# Patient Record
Sex: Male | Born: 1955 | Race: Asian | Hispanic: No | Marital: Married | Smoking: Never smoker
Health system: Southern US, Community
[De-identification: ages and names within clinical notes are randomized; demographics above are authoritative.]

## PROBLEM LIST (undated history)

## (undated) DIAGNOSIS — E785 Hyperlipidemia, unspecified: Secondary | ICD-10-CM

---

## 2015-03-28 ENCOUNTER — Emergency Department (HOSPITAL_COMMUNITY)
Admission: EM | Admit: 2015-03-28 | Discharge: 2015-03-28 | Disposition: A | Discharge status: Home / Self Care | Payer: BC Managed Care – PPO | Attending: Emergency Medicine | Admitting: Emergency Medicine

## 2015-03-28 ENCOUNTER — Emergency Department (HOSPITAL_COMMUNITY): Payer: BC Managed Care – PPO

## 2015-03-28 ENCOUNTER — Encounter (HOSPITAL_COMMUNITY): Payer: Self-pay | Admitting: *Deleted

## 2015-03-28 DIAGNOSIS — W28XXXA Contact with powered lawn mower, initial encounter: Secondary | ICD-10-CM | POA: Insufficient documentation

## 2015-03-28 DIAGNOSIS — S61309A Unspecified open wound of unspecified finger with damage to nail, initial encounter: Secondary | ICD-10-CM

## 2015-03-28 DIAGNOSIS — S61313A Laceration without foreign body of left middle finger with damage to nail, initial encounter: Secondary | ICD-10-CM | POA: Diagnosis present

## 2015-03-28 DIAGNOSIS — Y9289 Other specified places as the place of occurrence of the external cause: Secondary | ICD-10-CM | POA: Insufficient documentation

## 2015-03-28 DIAGNOSIS — Y999 Unspecified external cause status: Secondary | ICD-10-CM | POA: Insufficient documentation

## 2015-03-28 DIAGNOSIS — Y9389 Activity, other specified: Secondary | ICD-10-CM | POA: Insufficient documentation

## 2015-03-28 DIAGNOSIS — Z8349 Family history of other endocrine, nutritional and metabolic diseases: Secondary | ICD-10-CM | POA: Diagnosis not present

## 2015-03-28 DIAGNOSIS — Z23 Encounter for immunization: Secondary | ICD-10-CM | POA: Diagnosis not present

## 2015-03-28 HISTORY — DX: Hyperlipidemia, unspecified: E78.5

## 2015-03-28 MED ORDER — BUPIVACAINE HCL (PF) 0.5 % IJ SOLN
10.0000 mL | Freq: Once | INTRAMUSCULAR | Status: AC
Start: 2015-03-28 — End: 2015-03-28
  Administered 2015-03-28: 10 mL
  Filled 2015-03-28: qty 30

## 2015-03-28 MED ORDER — CEPHALEXIN 500 MG PO CAPS: 500.0000 mg | ORAL_CAPSULE | Freq: Four times a day (QID) | ORAL | Status: AC

## 2015-03-28 MED ORDER — OXYCODONE-ACETAMINOPHEN 5-325 MG PO TABS
2.0000 | ORAL_TABLET | Freq: Once | ORAL | Status: AC
Start: 2015-03-28 — End: 2015-03-28
  Administered 2015-03-28: 2 via ORAL
  Filled 2015-03-28: qty 2

## 2015-03-28 MED ORDER — HYDROGEN PEROXIDE 3 % EX SOLN
CUTANEOUS | Status: AC
  Administered 2015-03-28: 15:00:00
  Filled 2015-03-28: qty 473

## 2015-03-28 MED ORDER — TETANUS-DIPHTH-ACELL PERTUSSIS 5-2.5-18.5 LF-MCG/0.5 IM SUSP
0.5000 mL | Freq: Once | INTRAMUSCULAR | Status: AC
  Administered 2015-03-28: 0.5 mL via INTRAMUSCULAR
  Filled 2015-03-28: qty 0.5

## 2015-03-28 MED ORDER — OXYCODONE-ACETAMINOPHEN 5-325 MG PO TABS: 2.0000 | ORAL_TABLET | ORAL | Status: AC | PRN

## 2015-03-28 MED ORDER — ONDANSETRON 4 MG PO TBDP
4.0000 mg | ORAL_TABLET | Freq: Once | ORAL | Status: AC
  Administered 2015-03-28: 4 mg via ORAL
  Filled 2015-03-28: qty 1

## 2015-03-28 NOTE — ED Notes (Signed)
Lawn mower wheel was stuck, attempted to adjust wheel and accidentally came in contact with lawn mower blade, while on. Distal palmar surface of left middle finger - degloved. Bleeding controlled

## 2015-03-28 NOTE — ED Provider Notes (Signed)
CSN: [id number]     Arrival date & time 03/28/15  1257 History  This chart was scribed for non-physician provider Hebert Soho, PA-C, working with Layla Maw Ward, DO by Phillis Haggis, ED Scribe. This patient was seen in room WTR9/WTR9 and patient care was started at 1:35 PM.   Chief Complaint  Patient presents with  . Laceration    The history is provided by the patient. No language interpreter was used.   HPI Comments: Brian Gardner is a 59 y.o. male who presents to the Emergency Department complaining of a left middle finger laceration onset 2 hours ago. He states that he was trying to adjust a blade in his lawn mower when he cut the distal palmar surface of the left middle finger; back of tip of finger is missing and the pt does not know where the rest of the finger is. He states that his last tdap was in 2006. He denies taking anti-coagulants today. He denies history of DM or any other medical problems. He denies numbness, weakness, or rash.   Past Medical History  Diagnosis Date  . Dyslipidemia    History reviewed. No pertinent past surgical history. History reviewed. No pertinent family history. History  Substance Use Topics  . Smoking status: Never Smoker   . Smokeless tobacco: Not on file  . Alcohol Use: No    Review of Systems  Skin: Positive for wound. Negative for rash.  Neurological: Negative for weakness and numbness.   Allergies  Review of patient's allergies indicates no known allergies.  Home Medications   Prior to Admission medications   Medication Sig Start Date End Date Taking? Authorizing Provider  cephALEXin (KEFLEX) 500 MG capsule Take 1 capsule (500 mg total) by mouth 4 (four) times daily. 03/28/15   Saphyre Cillo Patel-Mills, PA-C  oxyCODONE-acetaminophen (PERCOCET/ROXICET) 5-325 MG per tablet Take 2 tablets by mouth every 4 (four) hours as needed for severe pain. 03/28/15   Zaivion Kundrat Patel-Mills, PA-C   Blood pressure 134/85, pulse 73, temperature 98 F  (36.7 C), temperature source Oral, resp. rate 17, SpO2 100 %.  Physical Exam  Constitutional: He is oriented to person, place, and time. He appears well-developed and well-nourished.  HENT:  Head: Normocephalic and atraumatic.  Eyes: EOM are normal.  Neck: Normal range of motion. Neck supple.  Cardiovascular: Normal rate.   Good radial pulses.   Pulmonary/Chest: Effort normal.  Musculoskeletal: Normal range of motion.  Full flexion and extension of left middle finger; nail intact; finger avulsion of the volar surface of the distal middle left finger. No bone exposed. Fat can be visualized but no tendon.  Neurological: He is alert and oriented to person, place, and time.  Skin: Skin is warm and dry.  Psychiatric: He has a normal mood and affect. His behavior is normal.  Nursing note and vitals reviewed.   ED Course  NERVE BLOCK Date/Time: 03/28/2015 2:19 PM Performed by: Catha Gosselin Authorized by: Catha Gosselin Consent: Verbal consent obtained. Written consent obtained. Risks and benefits: risks, benefits and alternatives were discussed Consent given by: patient Patient understanding: patient states understanding of the procedure being performed Patient consent: the patient's understanding of the procedure matches consent given Imaging studies: imaging studies available Patient identity confirmed: verbally with patient Indications: pain relief and debridement Body area: upper extremity Nerve: digital Laterality: left Patient sedated: no Preparation: Patient was prepped and draped in the usual sterile fashion. Patient position: sitting Needle gauge: 25 G Local anesthetic: bupivacaine 0.5% without epinephrine Anesthetic total:  5 ml Outcome: pain improved Patient tolerance: Patient tolerated the procedure well with no immediate complications   (including critical care time) DIAGNOSTIC STUDIES: Oxygen Saturation is 100% on RA, normal by my interpretation.     COORDINATION OF CARE: 1:37 PM-Discussed treatment plan which includes x-ray of finger, updating tdap, call to hand surgeon, cleaning of wound with pt at bedside and pt agreed to plan.   2:00 PM-Digital nerve block given to pt for pain relief  Labs Reviewed - No data to display  Imaging Review Dg Finger Middle Left  03/28/2015   CLINICAL DATA:  Post lawnmower injury, now with degloving.  EXAM: LEFT MIDDLE FINGER 2+V  COMPARISON:  None.  FINDINGS: There is an obliquely orientated amputation involving the soft tissues about the distal tip of the middle digit. This finding is without associated fracture or radiopaque foreign body.  IMPRESSION: Soft tissue amputation involving the distal tip of the middle phalanx without associated fracture or radiopaque foreign body.   Electronically Signed   By: Simonne Come M.D.   On: 03/28/2015 13:58     EKG Interpretation None      MDM   Final diagnoses:  Fingernail avulsion, partial, initial encounter   Patient's vitals are stable. X-ray of the middle left finger shows soft tissue amputation involving the distal tip of the middle phalanx without fracture or foreign body. I do not suspect a tendon rupture. He is able to flex and extend fingers at all joints. Tetanus was updated. Digital block was done in order to provide the patient relief for several hours and to be able to irrigate the wound. Patient's finger was thoroughly cleaned and soaked in saline with peroxide solution.  I consult to Dr. Karen Kays mouth, hand surgeon who suggested oral anabiotic's, applying Xeroform, wrapping the finger with gauze and Coban. I discussed keeping the wound clean and dry and not removing the dressing until he follows up with orthopedics. Patient can take Tylenol for pain and Percocet for breakthrough pain. Patient verbally agrees with the plan. Medications  hydrogen peroxide 3 % external solution (not administered)  bupivacaine (MARCAINE) 0.5 % injection 10 mL (10 mLs  Infiltration Given 03/28/15 1424)  Tdap (BOOSTRIX) injection 0.5 mL (0.5 mLs Intramuscular Given 03/28/15 1425)  oxyCODONE-acetaminophen (PERCOCET/ROXICET) 5-325 MG per tablet 2 tablet (2 tablets Oral Given 03/28/15 1424)   I personally performed the services described in this documentation, which was scribed in my presence. The recorded information has been reviewed and is accurate.    Catha Gosselin, PA-C 03/28/15 1500  Layla Maw Ward, DO 03/28/15 [phone]

## 2015-03-28 NOTE — Discharge Instructions (Signed)
Finger Avulsion Keep dressing clean and dry until you follow up with Dr. Merlyn Lot on Monday. Take anabiotic says prescribed. Do not overuse the left hand. Take Tylenol for pain and Percocet for breakthrough pain. Do not drive while taking pain medication. When the tip of the finger is lost, a new nail may grow back if part of the fingernail is left. The new nail may be deformed. If just the tip of the finger is lost, no repair may be needed unless there is bone showing. If bone is showing, your caregiver may need to remove the protruding bone and put on a bandage. Your caregiver will do what is best for you. Most of the time when a fingertip is lost, the end will gradually grow back on and look fairly normal, but it may remain sensitive to pressure and temperature extremes for a long time. HOME CARE INSTRUCTIONS   Keep your hand elevated above your heart to relieve pain and swelling.  Keep your dressing dry and clean.  Change your bandage in 24 hours or as directed.  Only take over-the-counter or prescription medicines for pain, discomfort, or fever as directed by your caregiver.  See your caregiver as needed for problems. SEEK MEDICAL CARE IF:   You have increased pain, swelling, drainage, or bleeding.  You have a fever.  You have swelling that spreads from your finger and into your hand. Make sure to check to see if you need a tetanus booster. Document Released: 11/28/2001 Document Revised: 03/20/2012 Document Reviewed: 10/23/2008 [facility] Patient Information 2015 [place], [place]. This information is not intended to replace advice given to you by your health care provider. Make sure you discuss any questions you have with your health care provider.

## 2016-09-01 IMAGING — CR DG FINGER MIDDLE 2+V*L*
3 series · 3 of 3 positions shown · non-contrast
Comparison: None.

CLINICAL DATA: Post lawnmower injury, now with degloving.

EXAM:
LEFT MIDDLE FINGER 2+V

[x finger pa left]
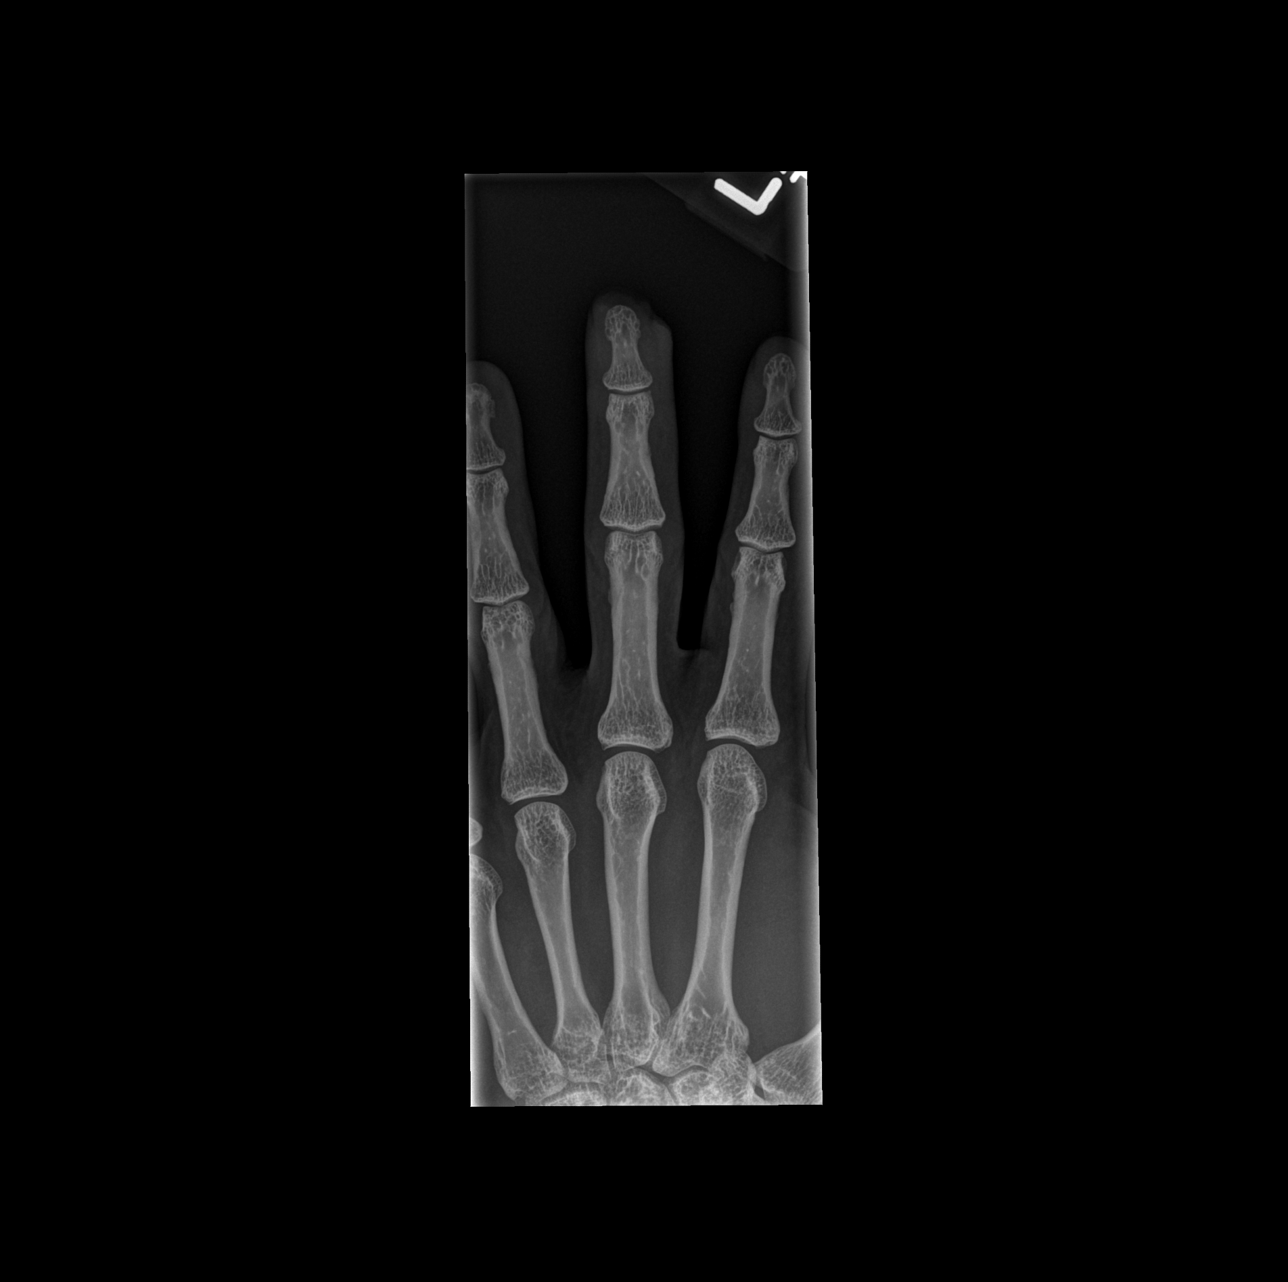

[x finger obl left]
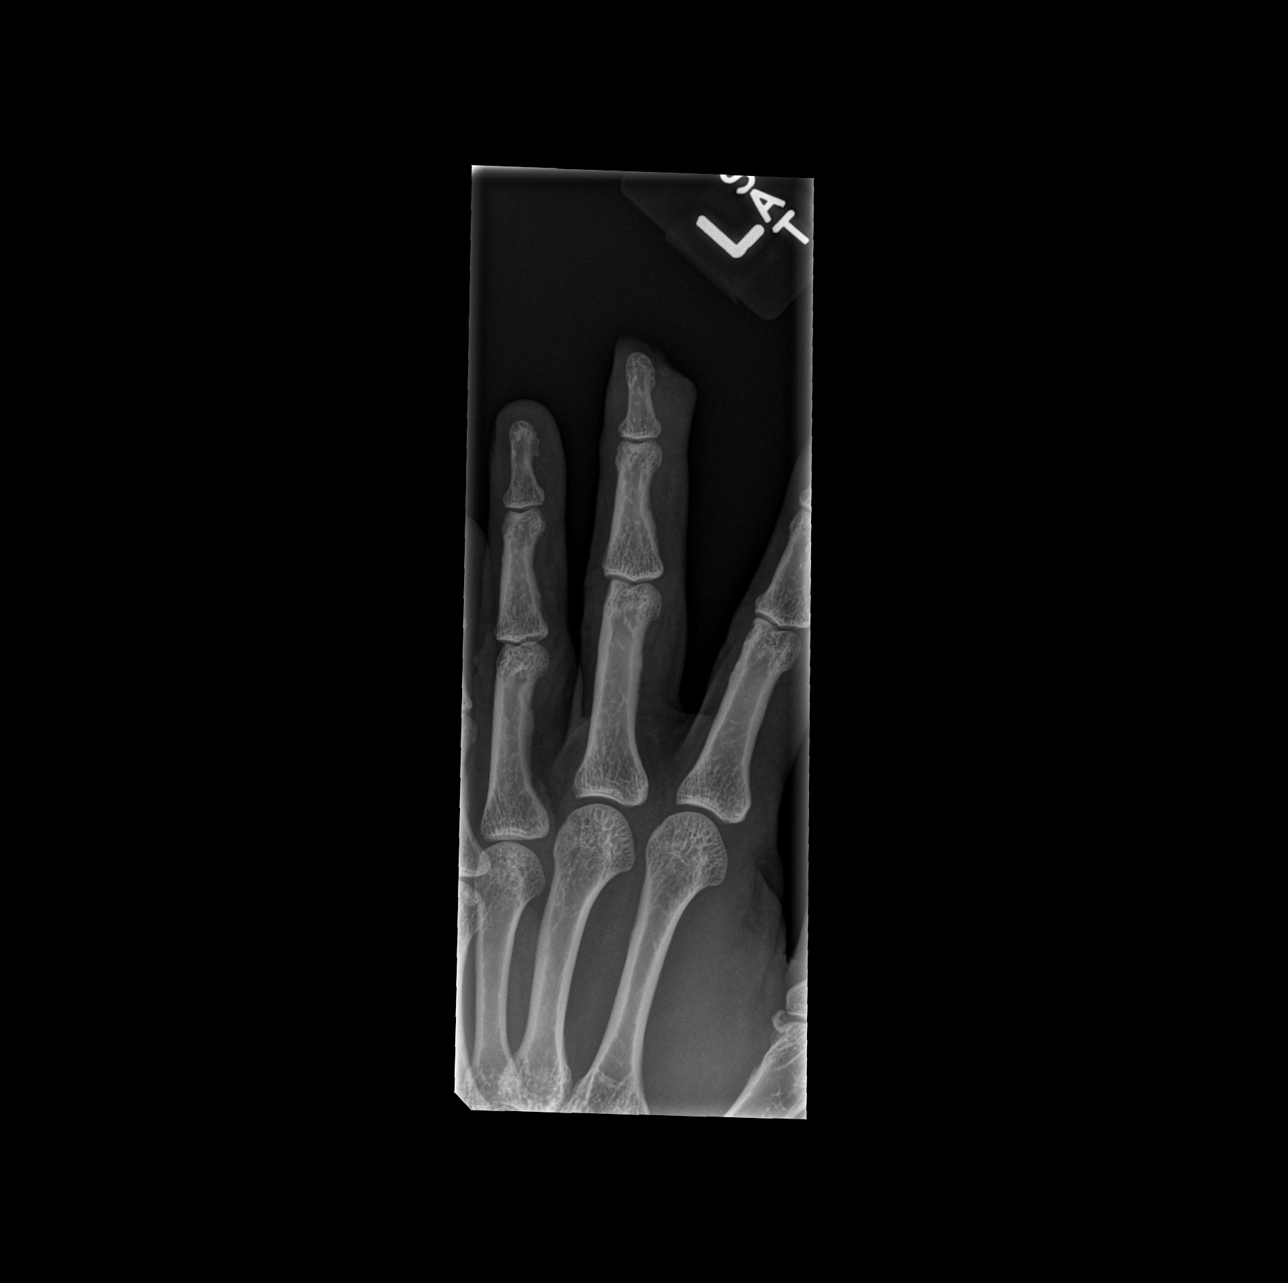

[x finger lat left]
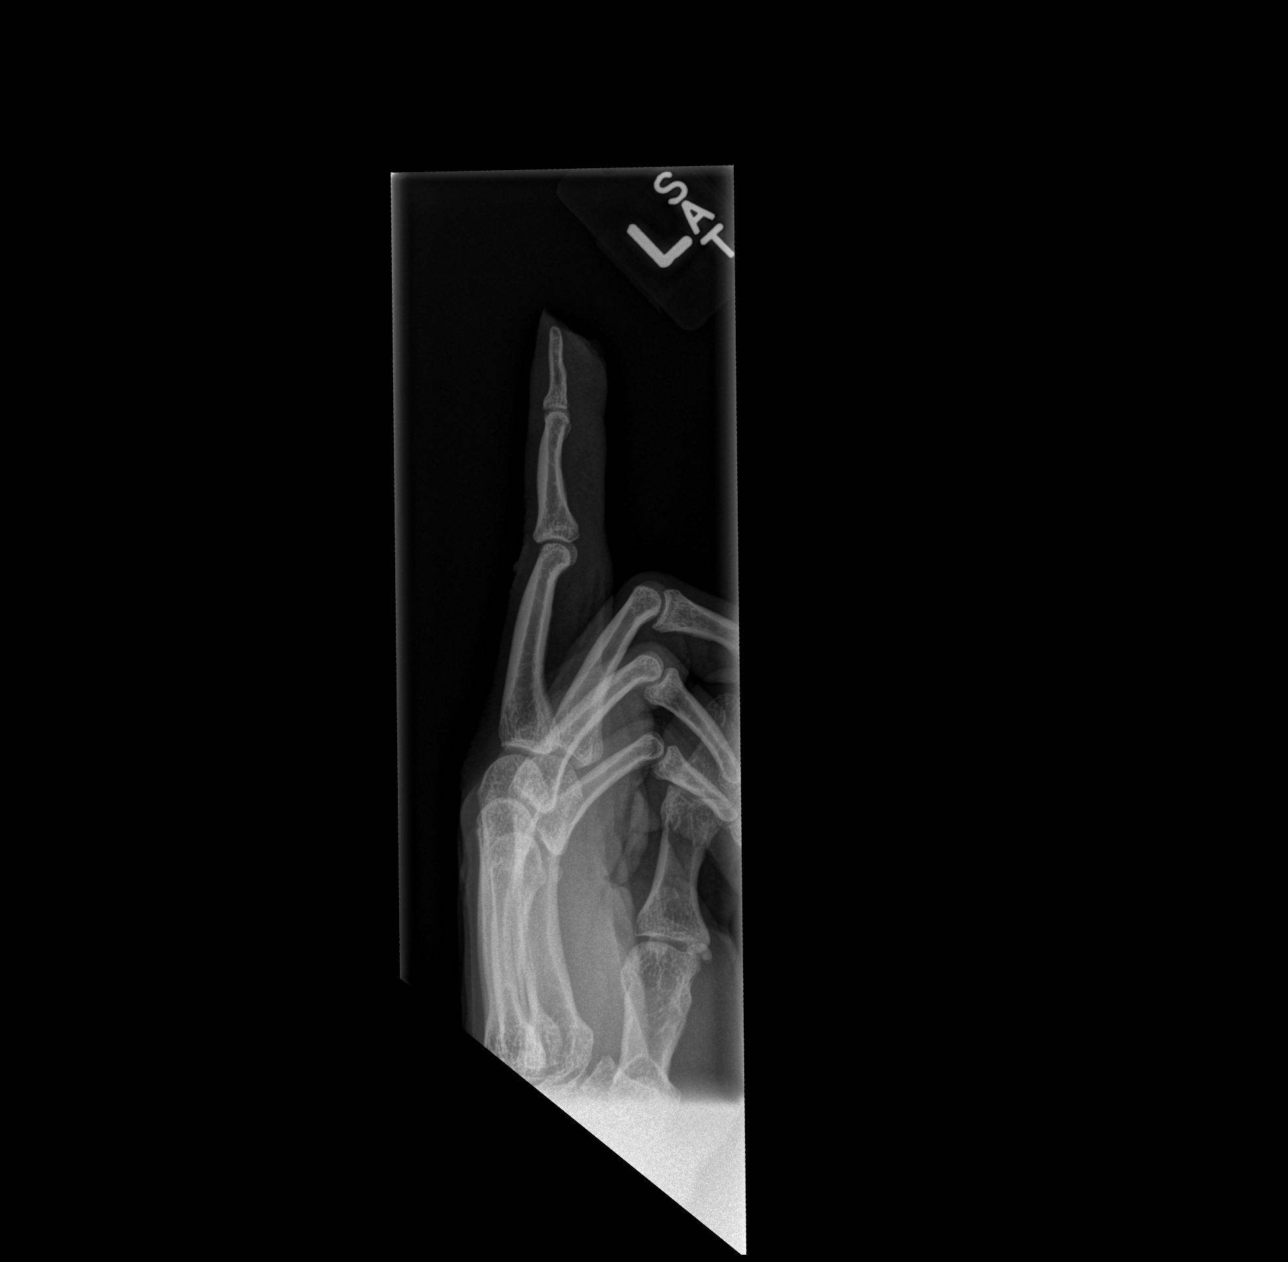

[3 of 3 positions shown; findings below may reference images not displayed]

FINDINGS: There is an obliquely orientated amputation involving the soft
tissues about the distal tip of the middle digit. This finding is
without associated fracture or radiopaque foreign body.
IMPRESSION: Soft tissue amputation involving the distal tip of the middle
phalanx without associated fracture or radiopaque foreign body.

## 2017-08-11 ENCOUNTER — Ambulatory Visit
Admission: RE | Admit: 2017-08-11 | Discharge: 2017-08-11 | Disposition: A | Discharge status: Home / Self Care | Payer: BC Managed Care – PPO | Source: Ambulatory Visit | Attending: Family Medicine | Admitting: Family Medicine

## 2017-08-11 ENCOUNTER — Other Ambulatory Visit: Payer: Self-pay | Admitting: Family Medicine

## 2017-08-11 DIAGNOSIS — R1033 Periumbilical pain: Secondary | ICD-10-CM

## 2018-09-02 IMAGING — CR DG ABDOMEN 2V
2 series · 2 of 2 positions shown · non-contrast
Comparison: None.

CLINICAL DATA: Constipation for 2 or 3 weeks.

EXAM:
ABDOMEN - 2 VIEW

[w abdomen upright]
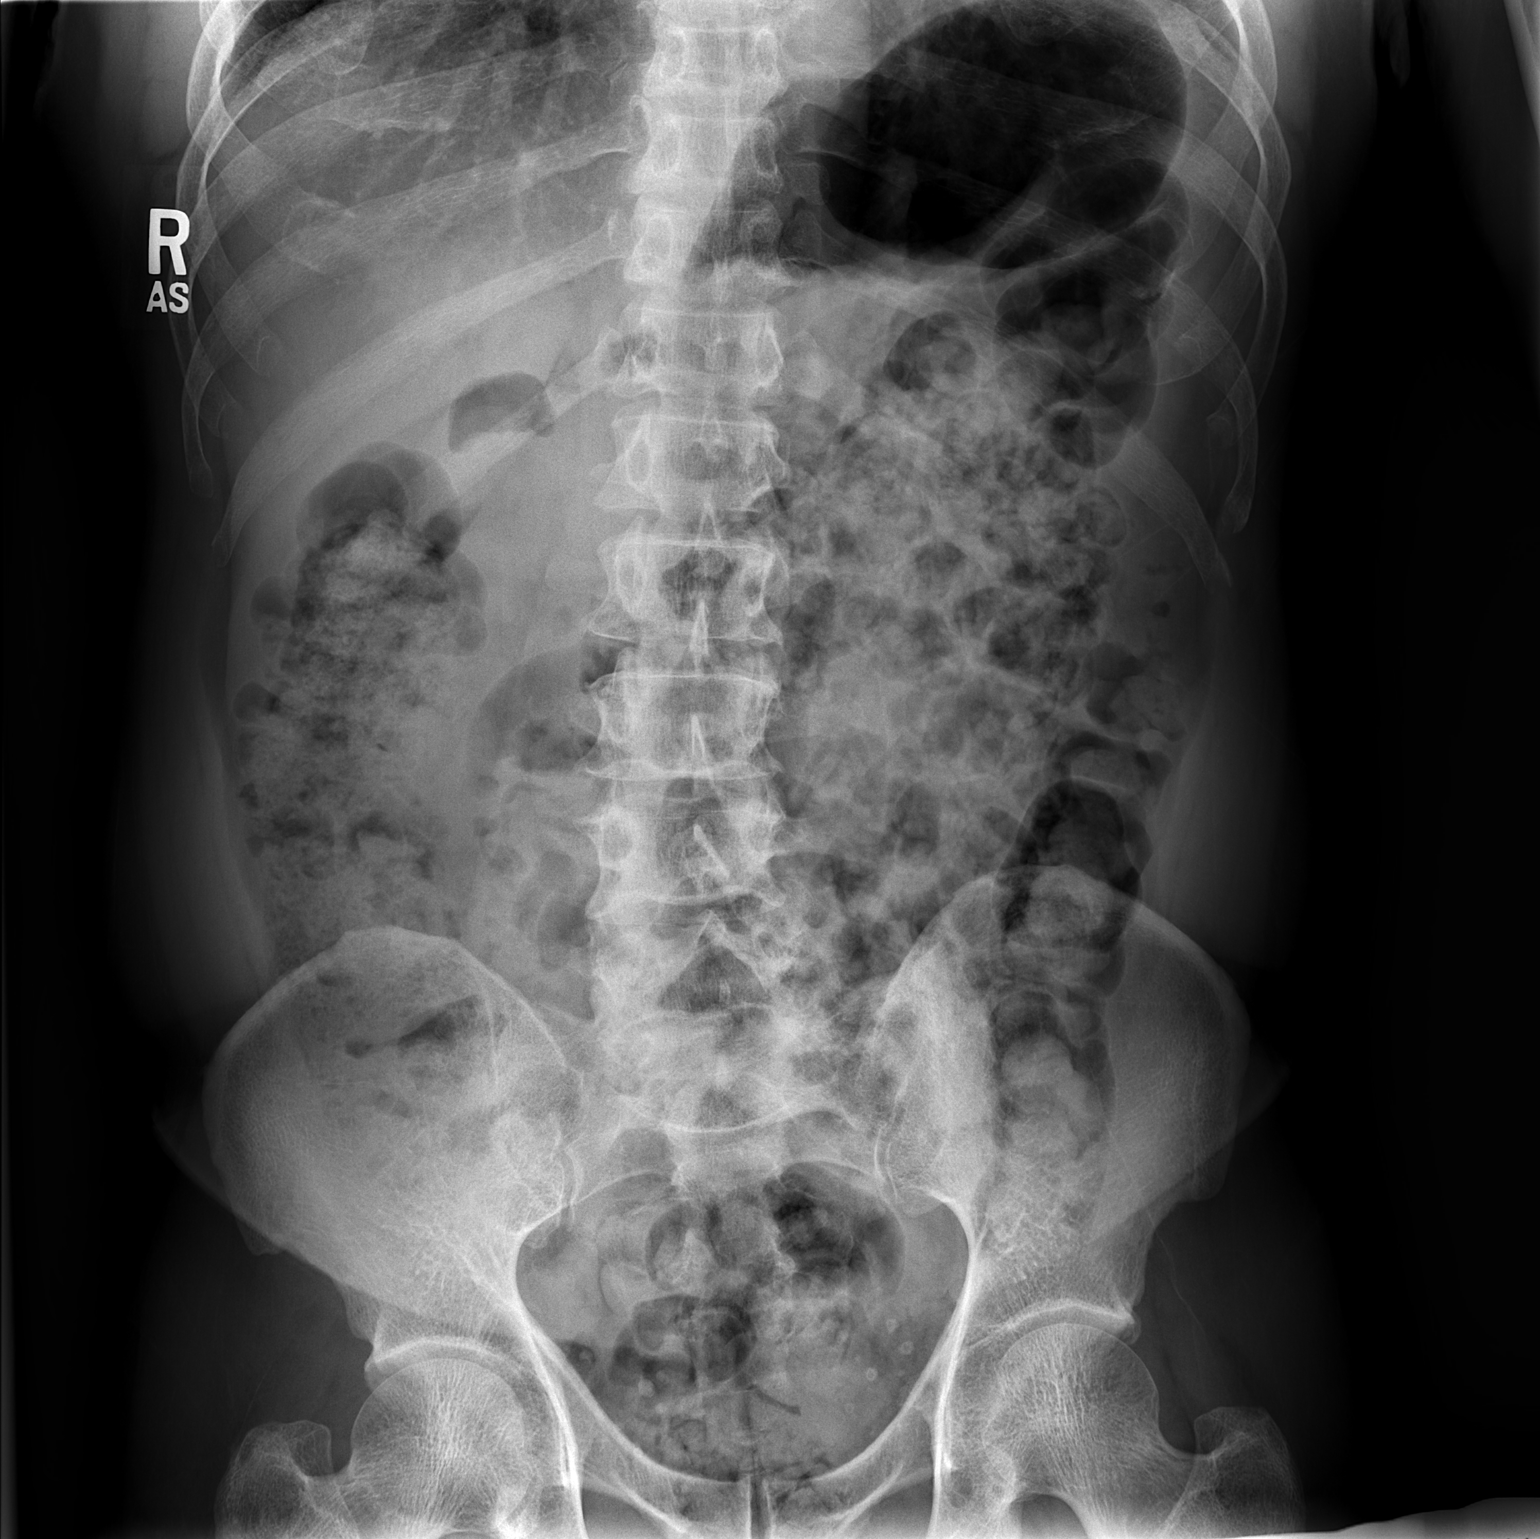

[t abdomen supine]
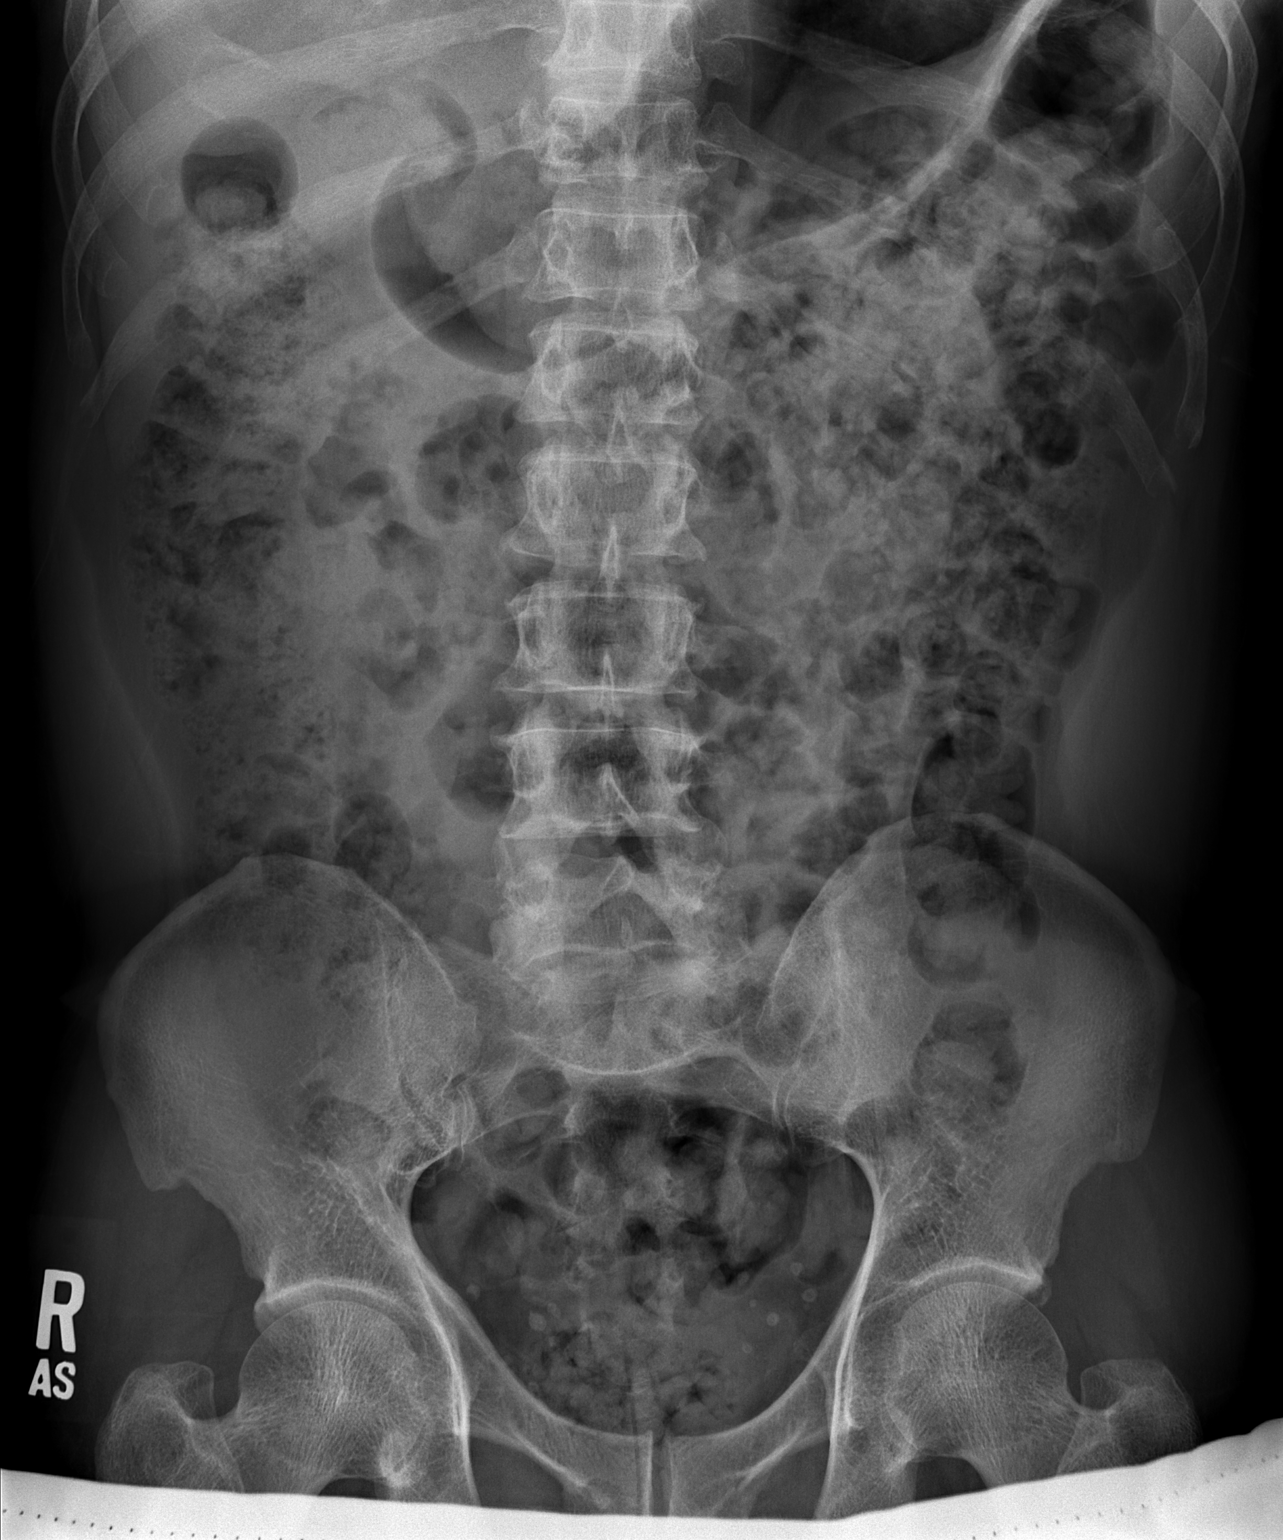

[2 of 2 positions shown; findings below may reference images not displayed]

FINDINGS: Moderate to severe fecal loading in the colon. No bowel obstruction.
Phleboliths in the left pelvis. No other acute abnormalities.
IMPRESSION: Moderate to severe fecal loading throughout the colon.

## 2023-12-28 ENCOUNTER — Other Ambulatory Visit (HOSPITAL_COMMUNITY): Payer: Self-pay | Admitting: Family Medicine

## 2023-12-28 DIAGNOSIS — E7800 Pure hypercholesterolemia, unspecified: Secondary | ICD-10-CM

## 2024-01-11 ENCOUNTER — Ambulatory Visit (HOSPITAL_COMMUNITY)
Admission: RE | Admit: 2024-01-11 | Discharge: 2024-01-11 | Disposition: A | Discharge status: Home / Self Care | Payer: Self-pay | Source: Ambulatory Visit | Attending: Family Medicine | Admitting: Family Medicine

## 2024-01-11 DIAGNOSIS — E7800 Pure hypercholesterolemia, unspecified: Secondary | ICD-10-CM
